# Patient Record
Sex: Male | Born: 1950 | Race: White | Hispanic: No | Marital: Married | Smoking: Light tobacco smoker
Health system: Southern US, Community
[De-identification: ages and names within clinical notes are randomized; demographics above are authoritative.]

## PROBLEM LIST (undated history)

## (undated) DIAGNOSIS — K5790 Diverticulosis of intestine, part unspecified, without perforation or abscess without bleeding: Secondary | ICD-10-CM

## (undated) DIAGNOSIS — R7989 Other specified abnormal findings of blood chemistry: Secondary | ICD-10-CM

## (undated) DIAGNOSIS — E785 Hyperlipidemia, unspecified: Secondary | ICD-10-CM

## (undated) DIAGNOSIS — E119 Type 2 diabetes mellitus without complications: Secondary | ICD-10-CM

## (undated) DIAGNOSIS — N529 Male erectile dysfunction, unspecified: Secondary | ICD-10-CM

## (undated) DIAGNOSIS — S82002A Unspecified fracture of left patella, initial encounter for closed fracture: Secondary | ICD-10-CM

## (undated) HISTORY — DX: Male erectile dysfunction, unspecified: N52.9

## (undated) HISTORY — DX: Other specified abnormal findings of blood chemistry: R79.89

## (undated) HISTORY — DX: Diverticulosis of intestine, part unspecified, without perforation or abscess without bleeding: K57.90

## (undated) HISTORY — PX: TONSILLECTOMY: SUR1361

## (undated) HISTORY — DX: Type 2 diabetes mellitus without complications: E11.9

## (undated) HISTORY — DX: Unspecified fracture of left patella, initial encounter for closed fracture: S82.002A

## (undated) HISTORY — DX: Hyperlipidemia, unspecified: E78.5

---

## 1998-11-22 ENCOUNTER — Emergency Department (HOSPITAL_COMMUNITY): Admission: EM | Admit: 1998-11-22 | Discharge: 1998-11-22 | Payer: Self-pay | Admitting: Emergency Medicine

## 2001-05-17 ENCOUNTER — Emergency Department (HOSPITAL_COMMUNITY): Admission: EM | Admit: 2001-05-17 | Discharge: 2001-05-17 | Payer: Self-pay | Admitting: Emergency Medicine

## 2001-05-17 ENCOUNTER — Encounter: Payer: Self-pay | Admitting: *Deleted

## 2001-09-06 ENCOUNTER — Encounter: Admission: RE | Admit: 2001-09-06 | Discharge: 2001-09-06 | Payer: Self-pay | Admitting: Family Medicine

## 2001-09-06 ENCOUNTER — Encounter: Payer: Self-pay | Admitting: Family Medicine

## 2001-09-10 ENCOUNTER — Encounter: Payer: Self-pay | Admitting: Orthopedic Surgery

## 2001-09-10 ENCOUNTER — Ambulatory Visit (HOSPITAL_COMMUNITY): Admission: RE | Admit: 2001-09-10 | Discharge: 2001-09-10 | Payer: Self-pay | Admitting: Orthopedic Surgery

## 2002-01-07 ENCOUNTER — Ambulatory Visit (HOSPITAL_COMMUNITY): Admission: RE | Admit: 2002-01-07 | Discharge: 2002-01-07 | Payer: Self-pay | Admitting: *Deleted

## 2007-02-18 HISTORY — PX: WISDOM TOOTH EXTRACTION: SHX21

## 2010-11-04 LAB — BASIC METABOLIC PANEL: BUN: 14 mg/dL (ref 4–21)

## 2010-12-05 ENCOUNTER — Ambulatory Visit (HOSPITAL_BASED_OUTPATIENT_CLINIC_OR_DEPARTMENT_OTHER)
Admission: RE | Admit: 2010-12-05 | Discharge: 2010-12-05 | Disposition: A | Discharge status: Home / Self Care | Payer: Managed Care, Other (non HMO) | Source: Ambulatory Visit | Attending: Orthopedic Surgery | Admitting: Orthopedic Surgery

## 2010-12-05 ENCOUNTER — Other Ambulatory Visit: Payer: Self-pay | Admitting: Orthopedic Surgery

## 2010-12-05 DIAGNOSIS — Z01810 Encounter for preprocedural cardiovascular examination: Secondary | ICD-10-CM | POA: Insufficient documentation

## 2010-12-05 DIAGNOSIS — Z01812 Encounter for preprocedural laboratory examination: Secondary | ICD-10-CM | POA: Insufficient documentation

## 2010-12-05 DIAGNOSIS — D1739 Benign lipomatous neoplasm of skin and subcutaneous tissue of other sites: Secondary | ICD-10-CM | POA: Insufficient documentation

## 2010-12-05 LAB — POCT HEMOGLOBIN-HEMACUE: Hemoglobin: 14.2 g/dL (ref 13.0–17.0)

## 2010-12-19 NOTE — Op Note (Signed)
NAMEJAXSEN, Alexander Blair            ACCOUNT NO.:  [id number]  MEDICAL RECORD NO.:  [id number]  LOCATION:                                 FACILITY:  PHYSICIAN:  Alexander Fitch. Tracy Kinner, M.D.      DATE OF BIRTH:  DATE OF PROCEDURE:  12/05/2010 DATE OF DISCHARGE:                              OPERATIVE REPORT   PREOPERATIVE DIAGNOSIS:  Large hypothenar and volar wrist lipoma, right distal forearm and hypothenar eminence with MRI documenting probable noninvolvement of median or ulnar nerves.  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  OPERATION:  Removal of 6 x 3 cm lipoma, right wrist, and hypothenar eminence, right hand.  OPERATING SURGEON:  Alexander Fitch. Kylia Grajales, MD  ASSISTANT:  Marveen Reeks Dasnoit, PA-C  ANESTHESIA:  General by LMA.  SUPERVISING ANESTHESIOLOGIST:  Alexander Mayo, MD  INDICATIONS:  Alexander Blair is a 60 year old gentleman referred through the courtesy of Dr. Dewaine Oats of [facility] for evaluation and management of a tumor on the volar aspect of his right wrist.  He had what appeared to be a tense lipoma or cyst at the distal wrist flexion crease.  Careful inspection of the hand revealed swelling in the hypothenar eminence and proximal palm.  As it is not unusual to see a fibrolipoma of the median nerve in this region, we referred Alexander Blair for an MRI of the wrist with contrast.  The MRI revealed that this was infiltrating into the subcutaneous region of the palm superficial to the transverse carpal ligament and was very closely situated to the ulnar nerve and artery in the hypothenar eminence and Guyon's canal.  We advised Alexander Blair to undergo exploration of this region.  We anticipated that we would need to dissect this mass from the environs of the ulnar nerve and artery.  It is possible that this could represent a fibrolipoma of  the ulnar nerve.  After informed consent, Alexander Blair was brought to the operating room at this time.  Preoperatively, he was advised of the potential risks and benefits of the surgery.  Should this be a fibrolipoma of the nerve, he could suffer a neurologic deficit.  Our goal was to relieve his mass effect, pressure symptoms, and prevent recurrence if possible.  His final prognosis will be dependent on histopathologic evaluation of this biopsy specimen.  PROCEDURE:  Alexander Blair was brought to room 1 of the [facility] and placed in supine position on the operating table.  Following the induction of general anesthesia by LMA technique under Dr. Jarrett Ables direct supervision, the right arm and hand were prepped with Betadine soap solution and sterilely draped.  A pneumatic tourniquet was applied to the proximal right brachium.  Following exsanguination of the right arm with an Esmarch bandage, the tourniquet on the proximal brachium was inflated to 250 mmHg.  Following routine surgical time-out, the procedure commenced with a transverse incision at the distal wrist flexion crease.  A tense lipoma was noted deep to the fascia.  The palmaris longus was identified.  The position of the ulnar artery and nerve and the palmar cutaneous branch  of the median nerve were all identified followed by circumferential dissection of an infiltrating lipoma.  The lipoma extended into the distal forearm 2.5 cm superficial to the median nerve and fascia.  It infiltrated into the canal of Guyon paralleling the path of the ulnar artery and ulnar nerve.  It infiltrated into the palm deep to the dermis but superficial to the deepest layers of the palmar fascia.  This was circumferentially dissected.  We ultimately created a T-shaped incision paralleling a standard carpal tunnel incision to allow safe exposure of the hypothenar eminence.  The palmaris brevis muscle was infiltrated with the  mass and was carefully dissected away from the mass.  We ultimately identified the ulnar artery and nerve proximally and carefully teased the mass off of the artery.  The vasa nervorum and the adventitia of the ulnar artery were involved with the lipoma.  These were carefully dissected with tenotomy scissors under loupe magnification.  After the entire mass was excised, it measured approximately 6 x 3 cm. Part of the mass was hypervascular and part appeared to be less vascular.  This was placed in formalin and passed off for pathologic evaluation.  The wound was then inspected with the tourniquet released for hemostasis.  Bleeding was controlled by direct pressure and bipolar cautery under saline.  The ulnar nerve and artery were otherwise normal in appearance.  Several cutaneous branches and muscle branches of the ulnar artery were inspected and found to be in normal repair.  The wound was then repaired with subcutaneous suture of 4-0 Vicryl and intradermal 3-0 Prolene segmental suture.  For aftercare, Mr. Kaufhold was placed in compressive dressing with sterile gauze, sterile Webril, and an Ace bandage.  We will see him back for followup in our office in 1 week for dressing change and probable suture removal as well as the discussion of his pathologic findings.  For aftercare, he is provided prescription for Vicodin 5 mg 1 p.o. q.4-6 hours p.r.n. pain.     Alexander Blair, M.D.     RVS/MEDQ  D:  12/05/2010  T:  12/05/2010  Job:  [id number]  cc:   Molly Maduro L. Foy [place], M.D.  Electronically Signed by Josephine Igo M.D. on 12/19/2010 05:11:12 PM

## 2012-02-18 HISTORY — PX: KNEE SURGERY: SHX244

## 2012-07-27 LAB — CBC AND DIFFERENTIAL
HCT: 42 % (ref 41–53)
Hemoglobin: 14.2 g/dL (ref 13.5–17.5)
Neutrophils Absolute: 3 /uL
Platelets: 200 10*3/uL (ref 150–399)
WBC: 6.8 10^3/mL

## 2012-09-07 LAB — HM COLONOSCOPY

## 2013-10-26 ENCOUNTER — Ambulatory Visit (INDEPENDENT_AMBULATORY_CARE_PROVIDER_SITE_OTHER): Payer: 59 | Admitting: Cardiology

## 2013-10-26 ENCOUNTER — Encounter: Payer: Self-pay | Admitting: Cardiology

## 2013-10-26 ENCOUNTER — Encounter (HOSPITAL_COMMUNITY): Payer: Self-pay | Admitting: *Deleted

## 2013-10-26 VITALS — BP 142/92 | HR 66 | Ht 70.0 in | Wt 227.5 lb

## 2013-10-26 DIAGNOSIS — E109 Type 1 diabetes mellitus without complications: Secondary | ICD-10-CM

## 2013-10-26 DIAGNOSIS — E119 Type 2 diabetes mellitus without complications: Secondary | ICD-10-CM | POA: Insufficient documentation

## 2013-10-26 DIAGNOSIS — R209 Unspecified disturbances of skin sensation: Secondary | ICD-10-CM

## 2013-10-26 DIAGNOSIS — R200 Anesthesia of skin: Secondary | ICD-10-CM | POA: Insufficient documentation

## 2013-10-26 DIAGNOSIS — E1065 Type 1 diabetes mellitus with hyperglycemia: Secondary | ICD-10-CM

## 2013-10-26 NOTE — Progress Notes (Signed)
HPI The patient presents for evaluation of multiple cardiovascular risk factors. He has had no history of coronary disease. He did have an episode of hand numbness about 4 weeks ago and this made him somewhat anxious. He said this happened at rest. He has not had this with activities. It came and went spontaneously over a couple of days. He did not describe any chest pressure or neck discomfort. He did not have any arm discomfort. He doesn't have any shortness of breath, PND or orthopnea. He doesn't have any palpitations, presyncope or syncope. He has no edema.  No Known Allergies  Current Outpatient Prescriptions  Medication Sig Dispense Refill  . atorvastatin (LIPITOR) 40 MG tablet Take 20 mg by mouth daily.        No current facility-administered medications for this visit.    Past Medical History  Diagnosis Date  . Hyperlipidemia   . Diverticulosis   . Obesity   . Rectal bleeding     Past Surgical History  Procedure Laterality Date  . Knee surgery  02/2013  . Tonsillectomy  35 yrs ago  . Colonoscopy  05/09, 11/03, 07/14    Family History  Problem Relation Age of Onset  . Hypertension Father   . Cancer Father   . Heart attack Paternal Grandfather   . Diabetes Brother   . Diabetes Father     History   Social History  . Marital Status: Married    Spouse Name: N/A    Number of Children: N/A  . Years of Education: N/A   Occupational History  . Not on file.   Social History Main Topics  . Smoking status: Light Tobacco Smoker    Last Attempt to Quit: 10/27/1983  . Smokeless tobacco: Not on file  . Alcohol Use: Yes  . Drug Use: No  . Sexual Activity: Not on file   Other Topics Concern  . Not on file   Social History Narrative  . No narrative on file    ROS:  . Positive for occasional mild headaches, constipation. Otherwise as stated in the history of present illness and negative for all other systems.  10/26/2013  PHYSICAL EXAM BP 142/92  Pulse 66  Ht  5\' 10"  (1.778 m)  Wt 227 lb 8 oz (103.193 kg)  BMI 32.64 kg/m2 GENERAL:  Well appearing HEENT:  Pupils equal round and reactive, fundi not visualized, oral mucosa unremarkable NECK:  No jugular venous distention, waveform within normal limits, carotid upstroke brisk and symmetric, no bruits, no thyromegaly LYMPHATICS:  No cervical, inguinal adenopathy LUNGS:  Clear to auscultation bilaterally BACK:  No CVA tenderness CHEST:  Unremarkable HEART:  PMI not displaced or sustained,S1 and S2 within normal limits, no S3, no S4, no clicks, no rubs, no  murmurs ABD:  Flat, positive bowel sounds normal in frequency in pitch, no bruits, no rebound, no guarding, no midline pulsatile mass, no hepatomegaly, no splenomegaly EXT:  2 plus pulses throughout, no edema, no cyanosis no clubbing SKIN:  No rashes no nodules NEURO:  Cranial nerves II through XII grossly intact, motor grossly intact throughout PSYCH:  Cognitively intact, oriented to person place and time  EKG:  Sinus rhythm, rate 66, axis within normal limits, intervals within normal limits, no acute ST-T wave changes.  ASSESSMENT AND PLAN  ARM PAIN:  The pretest probability of obstructive coronary disease is low. However, the patient does have significant risk factors.  I will bring the patient back for a POET (Plain Old Exercise  Test). This will allow me to screen for obstructive coronary disease, risk stratify and very importantly provide a prescription for exercise.  OVERWEIGHT:  The patient understands the need to lose weight with diet and exercise. We have discussed specific strategies for this.  DIABETES:  The patient does have type 2 diabetes with a hemoglobin A1c of 7.7. I reviewed this with him. I reviewed the fact that this is completely curable with diet and exercise in the vast majority of patients. We discussed specific strategies for this.  DYSLIPIDEMIA:  His LDL calculated was 93. HDL was 44. He will continue with therapy as  listed. I discussed the Mediterranean zone diet with him.

## 2013-10-26 NOTE — Patient Instructions (Signed)
Your physician recommends that you schedule a follow-up appointment in:  As needed  We are ordering a Treadmill test

## 2013-11-15 ENCOUNTER — Ambulatory Visit (HOSPITAL_COMMUNITY)
Admission: RE | Admit: 2013-11-15 | Discharge: 2013-11-15 | Disposition: A | Discharge status: Home / Self Care | Payer: 59 | Source: Ambulatory Visit | Attending: Cardiovascular Disease | Admitting: Cardiovascular Disease

## 2013-11-15 DIAGNOSIS — R209 Unspecified disturbances of skin sensation: Secondary | ICD-10-CM | POA: Diagnosis not present

## 2013-11-15 DIAGNOSIS — IMO0001 Reserved for inherently not codable concepts without codable children: Secondary | ICD-10-CM

## 2013-11-15 DIAGNOSIS — E109 Type 1 diabetes mellitus without complications: Secondary | ICD-10-CM | POA: Diagnosis not present

## 2013-11-15 DIAGNOSIS — R079 Chest pain, unspecified: Secondary | ICD-10-CM | POA: Insufficient documentation

## 2013-11-15 DIAGNOSIS — R200 Anesthesia of skin: Secondary | ICD-10-CM

## 2013-11-15 DIAGNOSIS — E1165 Type 2 diabetes mellitus with hyperglycemia: Secondary | ICD-10-CM

## 2013-11-15 DIAGNOSIS — E1065 Type 1 diabetes mellitus with hyperglycemia: Secondary | ICD-10-CM

## 2013-11-15 NOTE — Procedures (Signed)
Exercise Treadmill Test  Test  Exercise Tolerance Test Ordering MD: Marijo File, MD    Unique Test Number: 1  Treadmill:  1  Indication for ETT: chest pain - rule out ischemia  Contraindication to ETT: No   Stress Modality: exercise - treadmill  Cardiac Imaging Performed: non   Protocol: standard Bruce - maximal  Max BP:  147/88  Max MPHR (bpm):  157 85% MPR (bpm):  133  MPHR obtained (bpm):  148 % MPHR obtained:  94  Reached 85% MPHR (min:sec):  6z;30 Total Exercise Time (min-sec):  9  Workload in METS:  10.1 Borg Scale: 14  Reason ETT Terminated:  dyspnea    ST Segment Analysis At Rest: NSR with no ST changes. With Exercise: no evidence of significant ST depression  Other Information Arrhythmia:  No Angina during ETT:  absent (0) Quality of ETT:  diagnostic  ETT Interpretation:  normal - no evidence of ischemia by ST analysis  Comments: ETT with good exercise tolerance; no chest pain and normal BP response; no ST changes; negative adequate ETT.  Alexander Blair

## 2014-02-14 LAB — HM DIABETES FOOT EXAM

## 2014-07-19 DIAGNOSIS — N529 Male erectile dysfunction, unspecified: Secondary | ICD-10-CM

## 2014-07-19 DIAGNOSIS — R7989 Other specified abnormal findings of blood chemistry: Secondary | ICD-10-CM

## 2014-07-19 HISTORY — DX: Male erectile dysfunction, unspecified: N52.9

## 2014-07-19 HISTORY — DX: Other specified abnormal findings of blood chemistry: R79.89

## 2014-08-10 LAB — LIPID PANEL
Cholesterol: 131 mg/dL (ref 0–200)
HDL: 41 mg/dL (ref 35–70)
LDL CALC: 63 mg/dL
Triglycerides: 132 mg/dL (ref 40–160)

## 2014-08-10 LAB — PSA
PSA: 0.54
PSA: 0.54

## 2014-08-10 LAB — MICROALBUMIN, URINE: Microalb, Ur: <0.7

## 2014-12-12 LAB — HEMOGLOBIN A1C
HEMOGLOBIN A1C: 6
Hemoglobin A1C: 6

## 2014-12-12 LAB — BASIC METABOLIC PANEL
BUN: 14 mg/dL (ref 4–21)
CREATININE: 0.9 mg/dL (ref <=1.3)
Glucose: 114 mg/dL
Potassium: 4.8 mmol/L (ref 3.4–5.3)
Sodium: 139 mmol/L (ref 137–147)

## 2014-12-12 LAB — HM DIABETES FOOT EXAM

## 2015-05-18 ENCOUNTER — Telehealth: Payer: Self-pay | Admitting: Internal Medicine

## 2015-05-18 LAB — HM DIABETES EYE EXAM

## 2015-05-18 NOTE — Telephone Encounter (Signed)
Please advise 

## 2015-05-18 NOTE — Telephone Encounter (Signed)
Pt was referred by Evelena Peat (stating he is a current pt). He said he may have the name spelled wrong.   Pt has [employer] thru his employer.  Would Dr. Larose Kells take on as a new pt? Current PCP is retiring.

## 2015-05-18 NOTE — Telephone Encounter (Signed)
That is okay, schedule at the patient convenience

## 2015-05-28 ENCOUNTER — Encounter: Payer: Self-pay | Admitting: *Deleted

## 2015-05-28 ENCOUNTER — Telehealth: Payer: Self-pay | Admitting: *Deleted

## 2015-05-28 NOTE — Telephone Encounter (Addendum)
Pre-Visit Call completed with patient and chart updated.   Pre-Visit Info documented in Specialty Comments under [employer].    

## 2015-05-29 ENCOUNTER — Ambulatory Visit (INDEPENDENT_AMBULATORY_CARE_PROVIDER_SITE_OTHER): Payer: Self-pay | Admitting: Internal Medicine

## 2015-05-29 ENCOUNTER — Encounter: Payer: Self-pay | Admitting: Internal Medicine

## 2015-05-29 VITALS — BP 122/78 | HR 72 | Temp 97.9°F | Ht 69.0 in | Wt 207.4 lb

## 2015-05-29 DIAGNOSIS — Z7689 Persons encountering health services in other specified circumstances: Secondary | ICD-10-CM

## 2015-05-29 DIAGNOSIS — E785 Hyperlipidemia, unspecified: Secondary | ICD-10-CM | POA: Diagnosis not present

## 2015-05-29 DIAGNOSIS — E119 Type 2 diabetes mellitus without complications: Secondary | ICD-10-CM

## 2015-05-29 DIAGNOSIS — Z7189 Other specified counseling: Secondary | ICD-10-CM

## 2015-05-29 DIAGNOSIS — Z09 Encounter for follow-up examination after completed treatment for conditions other than malignant neoplasm: Secondary | ICD-10-CM | POA: Insufficient documentation

## 2015-05-29 LAB — COMPREHENSIVE METABOLIC PANEL
ALBUMIN: 4.3 g/dL (ref 3.5–5.2)
ALT: 19 U/L (ref 0–53)
AST: 16 U/L (ref 0–37)
Alkaline Phosphatase: 66 U/L (ref 39–117)
BUN: 16 mg/dL (ref 6–23)
CHLORIDE: 104 meq/L (ref 96–112)
CO2: 26 meq/L (ref 19–32)
CREATININE: 0.8 mg/dL (ref 0.40–1.50)
Calcium: 9.7 mg/dL (ref 8.4–10.5)
GFR: 103.07 mL/min (ref >60.00)
GLUCOSE: 185 mg/dL — AB (ref 70–99)
Potassium: 3.9 mEq/L (ref 3.5–5.1)
Sodium: 137 mEq/L (ref 135–145)
Total Bilirubin: 0.4 mg/dL (ref 0.2–1.2)
Total Protein: 7.3 g/dL (ref 6.0–8.3)

## 2015-05-29 LAB — HEMOGLOBIN A1C: HEMOGLOBIN A1C: 6.4 % (ref 4.6–6.5)

## 2015-05-29 NOTE — Progress Notes (Signed)
Pre visit review using our clinic review tool, if applicable. No additional management support is needed unless otherwise documented below in the visit note. 

## 2015-05-29 NOTE — Progress Notes (Signed)
   Subjective:    Patient ID: Alexander Blair, male    DOB: Dec 19, 1950, 65 y.o.   MRN: [id number]  DOS:  05/29/2015 Type of visit - description : new pt, previous M.D. retired Interval history: Here to get established, no major concerns. DM: Good compliance of medication, ambulatory CBGs at bedtime 90-140. High cholesterol: Good compliance with Lipitor, no apparent side effects   Review of Systems Denies chest pain or difficulty breathing No nausea, vomiting, diarrhea. Over the last year,  Diet has improved and is more active, per his own scales has lost about 30 pounds  Past Medical History  Diagnosis Date  . Hyperlipidemia   . Diverticulosis   . Diabetes [facility])     Past Surgical History  Procedure Laterality Date  . Knee surgery  02/2013    scope  . Tonsillectomy      Social History   Social History  . Marital Status: Married    Spouse Name: N/A  . Number of Children: 2  . Years of Education: N/A   Occupational History  . engineer    Social History Main Topics  . Smoking status: Light Tobacco Smoker    Last Attempt to Quit: 10/27/1983  . Smokeless tobacco: Not on file     Comment: quit in the 80s  . Alcohol Use: No  . Drug Use: No  . Sexual Activity: Not on file   Other Topics Concern  . Not on file   Social History Narrative   Original from [place]   Lives at home with wife.         Medication List       This list is accurate as of: 05/29/15  5:00 PM.  Always use your most recent med list.               aspirin 81 MG tablet  Take 81 mg by mouth daily.     atorvastatin 40 MG tablet  Commonly known as:  LIPITOR  Take 20 mg by mouth daily.     metFORMIN 500 MG tablet  Commonly known as:  GLUCOPHAGE  Take 500 mg by mouth daily.     ONE TOUCH ULTRA TEST test strip  Generic drug:  glucose blood  Reported on [id number]     [facility] DELICA LANCETS FINE Misc  Reported on 05/29/2015           Objective:   Physical Exam BP 122/78 mmHg   Pulse 72  Temp(Src) 97.9 F (36.6 C) (Oral)  Ht 5\' 9"  (1.753 m)  Wt 207 lb 6 oz (94.065 kg)  BMI 30.61 kg/m2  SpO2 94% General:   Well developed, well nourished . NAD.  HEENT:  Normocephalic . Face symmetric, atraumatic Neck: No thyromegaly Lungs:  CTA B Normal respiratory effort, no intercostal retractions, no accessory muscle use. Heart: RRR,  no murmur.  No pretibial edema bilaterally  Skin: Not pale. Not jaundice Neurologic:  alert & oriented X3.  Speech normal, gait appropriate for age and unassisted Psych--  Cognition and judgment appear intact.  Cooperative with normal attention span and concentration.  Behavior appropriate. No anxious or depressed appearing.      Assessment & Plan:   Assessment DM- dx ~ 2010 Hyperlipidemia  Treadmill test 11-2013 (-)  Plan: DM: Continue metformin, check a CMP and A1c. Get records Hyperlipidemia: Continue Lipitor, get records, refill as needed RTC 2-3 months for a physical fasting.

## 2015-05-29 NOTE — Assessment & Plan Note (Signed)
DM: Continue metformin, check a CMP and A1c. Get records Hyperlipidemia: Continue Lipitor, get records, refill as needed RTC 2-3 months for a physical fasting.

## 2015-05-29 NOTE — Patient Instructions (Signed)
GO TO THE LAB :      Get the blood work      FRONT DESK  Schedule the patient for a physical exam in 2 or 3 months , okay to put together two 15-min appointments Needs to be fasting  Ask  patient to sign a release of information then fax it to [place]: all Records

## 2015-06-14 ENCOUNTER — Telehealth: Payer: Self-pay | Admitting: *Deleted

## 2015-06-14 NOTE — Telephone Encounter (Signed)
Forwarded to Dr. Paz. JG//CMA 

## 2015-06-28 ENCOUNTER — Encounter: Payer: Self-pay | Admitting: Internal Medicine

## 2015-08-08 ENCOUNTER — Telehealth: Payer: Self-pay | Admitting: Internal Medicine

## 2015-08-08 ENCOUNTER — Encounter: Payer: Self-pay | Admitting: Internal Medicine

## 2015-08-08 DIAGNOSIS — Z008 Encounter for other general examination: Secondary | ICD-10-CM

## 2015-08-08 DIAGNOSIS — Z0189 Encounter for other specified special examinations: Principal | ICD-10-CM

## 2015-08-08 NOTE — Telephone Encounter (Signed)
Form is requiring fasting labs ((lipid panel, A1c (only if MD recommends) and waist circumference)).  Pt had a new new pt appt with Dr Larose Kells in April 2017, but fasting labs need to be between 11/18/14 and 11/17/15. Will pt need an office visit or just a lab/nurse visit? Please advise

## 2015-08-08 NOTE — Telephone Encounter (Signed)
Pt dropped off document to be filled out (Biometric Measures & Physical Confirmation)

## 2015-08-08 NOTE — Telephone Encounter (Signed)
Called and left message for pt to please return call.  Pt needs lab visit for fasting lipid panel. Pt must fast 8 hrs prior to appt. Pt also needs nurse visit for waist circumference.

## 2015-08-08 NOTE — Telephone Encounter (Signed)
Needs a form: Check a FLP, please arrange Check abdominal circumference Will complete form when labs are back.   Chart reviewed: Colonoscopies: 06/25/2007, 01/07/2002, 08-2012. Per chart review. No actual reports H/o E.D. Testosterone was low at 247 (2016), recheck 08/22/2014: 267, FSH 3.1 normal, LH 5.1 normal was prescribed testosterone supplements.. Other  labs:  08/10/2014: Microalbumin normal. PSA 0.5.  Cholesterol 131, HDL 41, LDL 63 12/12/2014 A1c 6.0. Creatinine 0.8, potassium 4.8,

## 2015-08-09 ENCOUNTER — Ambulatory Visit: Payer: 59 | Admitting: *Deleted

## 2015-08-09 ENCOUNTER — Other Ambulatory Visit (INDEPENDENT_AMBULATORY_CARE_PROVIDER_SITE_OTHER): Payer: 59

## 2015-08-09 DIAGNOSIS — Z008 Encounter for other general examination: Secondary | ICD-10-CM

## 2015-08-09 DIAGNOSIS — Z0189 Encounter for other specified special examinations: Secondary | ICD-10-CM | POA: Diagnosis not present

## 2015-08-09 DIAGNOSIS — Z0000 Encounter for general adult medical examination without abnormal findings: Secondary | ICD-10-CM

## 2015-08-09 LAB — LIPID PANEL
CHOL/HDL RATIO: 3
CHOLESTEROL: 141 mg/dL (ref 0–200)
HDL: 44.8 mg/dL (ref >39.00)
LDL Cholesterol: 79 mg/dL (ref 0–99)
NonHDL: 96.2
TRIGLYCERIDES: 85 mg/dL (ref 0.0–149.0)
VLDL: 17 mg/dL (ref 0.0–40.0)

## 2015-08-09 NOTE — Progress Notes (Signed)
Pre visit review using our clinic review tool, if applicable. No additional management support is needed unless otherwise documented below in the visit note.  Pt here for waist circumference measurement per 08/08/15 phone note.   Waist circumference: 43 in  Dorrene German, RN

## 2015-08-10 NOTE — Telephone Encounter (Signed)
Pt came in on 08/09/15 to have labs drawn and waist circumference measured. Form completed with results and forwarded to Dr. Larose Kells. JG//CMA

## 2015-08-13 NOTE — Telephone Encounter (Signed)
Called and lm for pt to please return call.  Form is completed and ready for pick up. It is at the front desk. Copy sent for scanning. JG//CMA

## 2015-08-20 ENCOUNTER — Encounter: Payer: 59 | Admitting: Internal Medicine

## 2015-12-18 ENCOUNTER — Encounter: Payer: Self-pay | Admitting: Internal Medicine

## 2015-12-18 ENCOUNTER — Ambulatory Visit (INDEPENDENT_AMBULATORY_CARE_PROVIDER_SITE_OTHER): Payer: 59 | Admitting: Internal Medicine

## 2015-12-18 VITALS — BP 118/78 | HR 58 | Temp 98.1°F | Resp 14 | Ht 69.0 in | Wt 204.4 lb

## 2015-12-18 DIAGNOSIS — Z114 Encounter for screening for human immunodeficiency virus [HIV]: Secondary | ICD-10-CM

## 2015-12-18 DIAGNOSIS — E1065 Type 1 diabetes mellitus with hyperglycemia: Secondary | ICD-10-CM | POA: Diagnosis not present

## 2015-12-18 DIAGNOSIS — Z0000 Encounter for general adult medical examination without abnormal findings: Secondary | ICD-10-CM

## 2015-12-18 LAB — HEMOGLOBIN A1C: HEMOGLOBIN A1C: 6.1 % (ref 4.6–6.5)

## 2015-12-18 LAB — BASIC METABOLIC PANEL
BUN: 16 mg/dL (ref 6–23)
CHLORIDE: 103 meq/L (ref 96–112)
CO2: 25 mEq/L (ref 19–32)
Calcium: 9.7 mg/dL (ref 8.4–10.5)
Creatinine, Ser: 0.85 mg/dL (ref 0.40–1.50)
GFR: 95.94 mL/min (ref >60.00)
GLUCOSE: 116 mg/dL — AB (ref 70–99)
POTASSIUM: 4.1 meq/L (ref 3.5–5.1)
Sodium: 138 mEq/L (ref 135–145)

## 2015-12-18 LAB — CBC WITH DIFFERENTIAL/PLATELET
BASOS PCT: 0.4 % (ref 0.0–3.0)
Basophils Absolute: 0 10*3/uL (ref 0.0–0.1)
EOS PCT: 2.8 % (ref 0.0–5.0)
Eosinophils Absolute: 0.2 10*3/uL (ref 0.0–0.7)
HCT: 45.9 % (ref 39.0–52.0)
HEMOGLOBIN: 15.5 g/dL (ref 13.0–17.0)
LYMPHS ABS: 2.4 10*3/uL (ref 0.7–4.0)
Lymphocytes Relative: 31.8 % (ref 12.0–46.0)
MCHC: 33.8 g/dL (ref 30.0–36.0)
MCV: 89.3 fl (ref 78.0–100.0)
MONO ABS: 0.6 10*3/uL (ref 0.1–1.0)
MONOS PCT: 7.4 % (ref 3.0–12.0)
NEUTROS PCT: 57.6 % (ref 43.0–77.0)
Neutro Abs: 4.3 10*3/uL (ref 1.4–7.7)
Platelets: 206 10*3/uL (ref 150.0–400.0)
RBC: 5.14 Mil/uL (ref 4.22–5.81)
RDW: 12.5 % (ref 11.5–15.5)
WBC: 7.5 10*3/uL (ref 4.0–10.5)

## 2015-12-18 LAB — MICROALBUMIN / CREATININE URINE RATIO
Creatinine,U: 185.9 mg/dL
Microalb Creat Ratio: 1 mg/g (ref 0.0–30.0)
Microalb, Ur: 1.8 mg/dL (ref 0.0–1.9)

## 2015-12-18 NOTE — Progress Notes (Signed)
Subjective:    Patient ID: Alexander Blair, male    DOB: [birth year], 65 y.o.   MRN: [id number]  DOS:  12/18/2015 Type of visit - description : CPX Interval history: we also discussed DM and cholesterol    Review of Systems Constitutional: No fever. No chills. No unexplained wt changes. No unusual sweats  HEENT: No dental problems, no ear discharge, no facial swelling, no voice changes. No eye discharge, no eye  redness , no  intolerance to light   Respiratory: No wheezing , no  difficulty breathing. No cough , no mucus production  Cardiovascular: No CP, no leg swelling , no  Palpitations  GI: no nausea, no vomiting, no diarrhea , no  abdominal pain.  No blood in the stools. No dysphagia, no odynophagia Occasionally red blood per rectum, chronic issue, patient thinks due to hemorrhoids    Endocrine: No polyphagia, no polyuria , no polydipsia  GU: No dysuria, gross hematuria, difficulty urinating. No urinary urgency, no frequency.  Musculoskeletal: No joint swellings or unusual aches or pains  Skin: No change in the color of the skin, palor , no  Rash  Allergic, immunologic: No environmental allergies , no  food allergies  Neurological: No dizziness no  syncope. No headaches. No diplopia, no slurred, no slurred speech, no motor deficits, no facial  Numbness  Hematological: No enlarged lymph nodes, no easy bruising , no unusual bleedings  Psychiatry: No suicidal ideas, no hallucinations, no beavior problems, no confusion.  No unusual/severe anxiety, no depression   Past Medical History:  Diagnosis Date  . Diabetes ([place])   . Diverticulosis   . ED (erectile dysfunction) 07/2014  . Hyperlipidemia   . Low testosterone 07/2014    Past Surgical History:  Procedure Laterality Date  . KNEE SURGERY Right 2014  . TONSILLECTOMY    . WISDOM TOOTH EXTRACTION  2009    Social History   Social History  . Marital status: Married    Spouse name: N/A  . Number of children: 2    . Years of education: N/A   Occupational History  . engineer    Social History Main Topics  . Smoking status: Light Tobacco Smoker    Last attempt to quit: 10/27/1983  . Smokeless tobacco: Never Used     Comment: quit in the 80s  . Alcohol use No  . Drug use: No  . Sexual activity: Not on file   Other Topics Concern  . Not on file   Social History Narrative   Original from [place]   Lives at home with wife.      Family History  Problem Relation Age of Onset  . Hypertension Father   . Cancer Father     type?   . Diabetes Father   . Heart disease Father   . Heart attack Paternal Grandfather   . Diabetes Brother   . Colon cancer Neg Hx   . Prostate cancer Neg Hx        Medication List       Accurate as of 12/18/15 11:59 PM. Always use your most recent med list.          aspirin 81 MG tablet Take 81 mg by mouth daily.   atorvastatin 40 MG tablet Commonly known as:  LIPITOR Take 20 mg by mouth daily.   metFORMIN 500 MG tablet Commonly known as:  GLUCOPHAGE Take 500 mg by mouth daily.   ONE TOUCH ULTRA TEST test strip Generic drug:  glucose  blood Reported on [id number]   [facility] DELICA LANCETS FINE Misc Reported on 05/29/2015          Objective:   Physical Exam BP 118/78 (BP Location: Left Arm, Patient Position: Sitting, Cuff Size: Normal)   Pulse (!) 58   Temp 98.1 F (36.7 C) (Oral)   Resp 14   Ht 5\' 9"  (1.753 m)   Wt 204 lb 6 oz (92.7 kg)   SpO2 98%   BMI 30.18 kg/m  General:   Well developed, well nourished . NAD.  Neck: No  thyromegaly , no LADs HEENT:  Normocephalic . Face symmetric, atraumatic Lungs:  CTA B Normal respiratory effort, no intercostal retractions, no accessory muscle use. Heart: RRR,  no murmur.  No pretibial edema bilaterally  Abdomen:  Not distended, soft, non-tender. No rebound or rigidity. No bruit    DIABETIC FEET EXAM: No lower extremity edema Normal pedal pulses bilaterally Skin normal, nails normal, no  calluses Pinprick examination of the feet normal. Neurologic:  alert & oriented X3.  Speech normal, gait appropriate for age and unassisted Strength symmetric and appropriate for age.  Psych: Cognition and judgment appear intact.  Cooperative with normal attention span and concentration.  Behavior appropriate. No anxious or depressed appearing.     Assessment & Plan:   Assessment DM- dx ~ 2010 Hyperlipidemia  Treadmill test 11-2013 (-)  Plan: DM: Reports CBGs at night has increased slightly lately to the 140s, no change in diet. Plan is to continue metformin, check A1c and microalbumin. Foot exam normal. Hyperlipidemia: On Lipitor, last FLP satisfactory. Also reported a slt decrease in sex drive, some ED, not causing problems, agreed on observation RTC 6 months

## 2015-12-18 NOTE — Patient Instructions (Signed)
GO TO THE LAB : Get the blood work     GO TO THE FRONT DESK Schedule your next appointment for a  Check up in 6 months, fasting  Check your blood sugars once daily, fasting should be between 90 and 120 After meals should not be more than 180.

## 2015-12-18 NOTE — Assessment & Plan Note (Addendum)
Only immunization due is prevnar for next year Had a flu shot  CCS: Cscope 2014, next per GI PSA 07-2014 wnl Diet and exercise discussed

## 2015-12-18 NOTE — Progress Notes (Signed)
Pre visit review using our clinic review tool, if applicable. No additional management support is needed unless otherwise documented below in the visit note. 

## 2015-12-19 LAB — HIV ANTIBODY (ROUTINE TESTING W REFLEX): HIV: NONREACTIVE

## 2015-12-19 NOTE — Assessment & Plan Note (Signed)
DM: Reports CBGs at night has increased slightly lately to the 140s, no change in diet. Plan is to continue metformin, check A1c and microalbumin. Foot exam normal. Hyperlipidemia: On Lipitor, last FLP satisfactory. Also reported a slt decrease in sex drive, some ED, not causing problems, agreed on observation RTC 6 months

## 2015-12-31 ENCOUNTER — Telehealth: Payer: Self-pay | Admitting: Internal Medicine

## 2015-12-31 MED ORDER — ATORVASTATIN CALCIUM 40 MG PO TABS: 20.0000 mg | ORAL_TABLET | Freq: Every day | ORAL | 2 refills | Status: DC

## 2015-12-31 MED ORDER — METFORMIN HCL 500 MG PO TABS: 500.0000 mg | ORAL_TABLET | Freq: Every day | ORAL | 2 refills | Status: DC

## 2015-12-31 NOTE — Telephone Encounter (Signed)
Rxs sent

## 2015-12-31 NOTE — Telephone Encounter (Signed)
Relation to [id number] Call back number:[phone] Pharmacy: [place], [place] [phone] (Phone) [phone] (Fax)     Reason for call:  Patient requesting a refill metFORMIN (GLUCOPHAGE) 500 MG tablet and atorvastatin (LIPITOR) 40 MG tablet

## 2016-01-01 HISTORY — PX: HEMORRHOID BANDING: SHX5850

## 2016-03-12 ENCOUNTER — Ambulatory Visit (INDEPENDENT_AMBULATORY_CARE_PROVIDER_SITE_OTHER): Payer: 59 | Admitting: Internal Medicine

## 2016-03-12 ENCOUNTER — Encounter: Payer: Self-pay | Admitting: Internal Medicine

## 2016-03-12 ENCOUNTER — Ambulatory Visit (HOSPITAL_BASED_OUTPATIENT_CLINIC_OR_DEPARTMENT_OTHER)
Admission: RE | Admit: 2016-03-12 | Discharge: 2016-03-12 | Disposition: A | Discharge status: Home / Self Care | Payer: 59 | Source: Ambulatory Visit | Attending: Internal Medicine | Admitting: Internal Medicine

## 2016-03-12 VITALS — BP 124/76 | HR 70 | Temp 98.1°F | Resp 14 | Ht 69.0 in | Wt 214.2 lb

## 2016-03-12 DIAGNOSIS — M1712 Unilateral primary osteoarthritis, left knee: Secondary | ICD-10-CM | POA: Diagnosis not present

## 2016-03-12 DIAGNOSIS — X58XXXA Exposure to other specified factors, initial encounter: Secondary | ICD-10-CM | POA: Insufficient documentation

## 2016-03-12 DIAGNOSIS — S8392XA Sprain of unspecified site of left knee, initial encounter: Secondary | ICD-10-CM | POA: Diagnosis present

## 2016-03-12 DIAGNOSIS — Q741 Congenital malformation of knee: Secondary | ICD-10-CM | POA: Diagnosis not present

## 2016-03-12 NOTE — Patient Instructions (Signed)
Get a x-ray of the knee down stairs  Tylenol  500 mg OTC 2 tabs a day every 8 hours as needed for pain  Try to minimize the use of ibuprofen to every other day  Ice pack at night  Call if no better in 2-3 weeks

## 2016-03-12 NOTE — Progress Notes (Signed)
Subjective:    Patient ID: Alexander Blair, male    DOB: 1950-05-22, 66 y.o.   MRN: [id number]  DOS:  03/12/2016 Type of visit - description : acute Interval history: 3 weeks ago he was playing with his daughter's dog, he was running, and to avoid step on the  dog he miss stepped, hyperflex his left great toe and twisted his left knee. Having pain since then, is actually slightly worse. The knee pain is anterior and encompass the proximal  pretibial area   Review of Systems No other injuries, no neck or back pain  Past Medical History:  Diagnosis Date  . Diabetes ([place])   . Diverticulosis   . ED (erectile dysfunction) 07/2014  . Hyperlipidemia   . Low testosterone 07/2014    Past Surgical History:  Procedure Laterality Date  . HEMORRHOID BANDING  01/01/2016  . KNEE SURGERY Right 2014  . TONSILLECTOMY    . WISDOM TOOTH EXTRACTION  2009    Social History   Social History  . Marital status: Married    Spouse name: N/A  . Number of children: 2  . Years of education: N/A   Occupational History  . engineer    Social History Main Topics  . Smoking status: Light Tobacco Smoker    Last attempt to quit: 10/27/1983  . Smokeless tobacco: Never Used     Comment: quit in the 80s  . Alcohol use No  . Drug use: No  . Sexual activity: Not on file   Other Topics Concern  . Not on file   Social History Narrative   Original from [place]   Lives at home with wife.       Allergies as of 03/12/2016   No Known Allergies     Medication List       Accurate as of 03/12/16  1:04 PM. Always use your most recent med list.          aspirin 81 MG tablet Take 81 mg by mouth daily.   atorvastatin 40 MG tablet Commonly known as:  LIPITOR Take 0.5 tablets (20 mg total) by mouth daily.   metFORMIN 500 MG tablet Commonly known as:  GLUCOPHAGE Take 1 tablet (500 mg total) by mouth daily.   ONE TOUCH ULTRA TEST test strip Generic drug:  glucose blood Reported on [id number]     [facility] DELICA LANCETS FINE Misc Reported on 05/29/2015          Objective:   Physical Exam BP 124/76 (BP Location: Left Arm, Patient Position: Sitting, Cuff Size: Normal)   Pulse 70   Temp 98.1 F (36.7 C) (Oral)   Resp 14   Ht 5\' 9"  (1.753 m)   Wt 214 lb 4 oz (97.2 kg)   SpO2 98%   BMI 31.64 kg/m  General:   Well developed, well nourished . NAD.  HEENT:  Normocephalic . Face symmetric, atraumatic MSK Right knee normal Left knee: No deformity, redness, minimal swelling if any. Range of motion normal, joint is stable.  L great toe: Has a subungual ecchymosis , otherwise dose normal.  Skin: Not pale. Not jaundice Neurologic:  alert & oriented X3.  Speech normal, gait appropriate for age and unassisted Psych--  Cognition and judgment appear intact.  Cooperative with normal attention span and concentration.  Behavior appropriate. No anxious or depressed appearing.      Assessment & Plan:   Assessment DM- dx ~ 2010 Hyperlipidemia  Treadmill test 11-2013 (-)  Plan: Knee sprain,  great toe injury. To be sure we'll get a x-ray, otherwise conservative treatment with Tylenol, ice packs and a knee sleeve. He is taking ibuprofen, that is okay but recommend to try to minimize it's use. If not better, will send to sports medicine

## 2016-03-12 NOTE — Progress Notes (Signed)
Pre visit review using our clinic review tool, if applicable. No additional management support is needed unless otherwise documented below in the visit note. 

## 2016-03-13 NOTE — Assessment & Plan Note (Signed)
Knee sprain, great toe injury. To be sure we'll get a x-ray, otherwise conservative treatment with Tylenol, ice packs and a knee sleeve. He is taking ibuprofen, that is okay but recommend to try to minimize it's use. If not better, will send to sports medicine

## 2016-04-03 ENCOUNTER — Encounter: Payer: Self-pay | Admitting: Internal Medicine

## 2016-04-03 LAB — HM SIGMOIDOSCOPY

## 2016-04-09 ENCOUNTER — Encounter: Payer: Self-pay | Admitting: Internal Medicine

## 2016-06-03 LAB — HM DIABETES EYE EXAM

## 2016-06-12 ENCOUNTER — Encounter: Payer: Self-pay | Admitting: Internal Medicine

## 2016-06-18 ENCOUNTER — Encounter: Payer: Self-pay | Admitting: Internal Medicine

## 2016-06-18 ENCOUNTER — Ambulatory Visit (INDEPENDENT_AMBULATORY_CARE_PROVIDER_SITE_OTHER): Payer: 59 | Admitting: Internal Medicine

## 2016-06-18 VITALS — BP 128/76 | HR 64 | Temp 97.5°F | Resp 14 | Ht 69.0 in | Wt 209.5 lb

## 2016-06-18 DIAGNOSIS — Z1159 Encounter for screening for other viral diseases: Secondary | ICD-10-CM

## 2016-06-18 DIAGNOSIS — E119 Type 2 diabetes mellitus without complications: Secondary | ICD-10-CM | POA: Diagnosis not present

## 2016-06-18 DIAGNOSIS — S8392XA Sprain of unspecified site of left knee, initial encounter: Secondary | ICD-10-CM | POA: Diagnosis not present

## 2016-06-18 DIAGNOSIS — E1065 Type 1 diabetes mellitus with hyperglycemia: Secondary | ICD-10-CM

## 2016-06-18 DIAGNOSIS — E785 Hyperlipidemia, unspecified: Secondary | ICD-10-CM | POA: Diagnosis not present

## 2016-06-18 LAB — LIPID PANEL
CHOL/HDL RATIO: 3
Cholesterol: 155 mg/dL (ref 0–200)
HDL: 48.7 mg/dL (ref >39.00)
LDL CALC: 81 mg/dL (ref 0–99)
NONHDL: 105.94
Triglycerides: 123 mg/dL (ref 0.0–149.0)
VLDL: 24.6 mg/dL (ref 0.0–40.0)

## 2016-06-18 LAB — HEMOGLOBIN A1C: Hgb A1c MFr Bld: 6.6 % — ABNORMAL HIGH (ref 4.6–6.5)

## 2016-06-18 LAB — AST: AST: 16 U/L (ref 0–37)

## 2016-06-18 LAB — ALT: ALT: 19 U/L (ref 0–53)

## 2016-06-18 MED ORDER — ATORVASTATIN CALCIUM 40 MG PO TABS: 20.0000 mg | ORAL_TABLET | Freq: Every day | ORAL | 2 refills | Status: DC

## 2016-06-18 MED ORDER — METFORMIN HCL 500 MG PO TABS: 500.0000 mg | ORAL_TABLET | Freq: Every day | ORAL | 2 refills | Status: DC

## 2016-06-18 NOTE — Patient Instructions (Signed)
GO TO THE LAB : Get the blood work     GO TO THE FRONT DESK Schedule your next appointment for a  physical exam in 6-7 months.

## 2016-06-18 NOTE — Assessment & Plan Note (Signed)
DM: On metformin qd, checking her A1c. Has been unable to exercise much lately, A1c may be slightly elevated Hyperlipidemia: Good compliance with Lipitor, check FLP, AST, ALT Knee sprain: After last visit, went to orthopedic surgery, MRI show a "fracture". Was Rx conservative treatment-crutches, got better but sxs resurface few weeks ago. He is planning to see ortho if sx continue RTC 6-7 months

## 2016-06-18 NOTE — Progress Notes (Signed)
Subjective:    Patient ID: Alexander Blair, male    DOB: 1950-08-02, 66 y.o.   MRN: [id number]  DOS:  06/18/2016 Type of visit - description : Routine checkup Interval history: DM: Good compliance of medication, ambulatory CBGs ranged from 120-160. High cholesterol: Good compliance with statins, due for labs. He was here, knee pain got worse, went to the orthopedic doctor, MRI was done, dx w/ a fracture?Marland Kitchen Was recommended conservative treatment, symptoms got better but in the last few weeks they resurface after he did some yard work.   Review of Systems   Past Medical History:  Diagnosis Date  . Diabetes ([place])   . Diverticulosis   . ED (erectile dysfunction) 07/2014  . Hyperlipidemia   . Left patella fracture   . Low testosterone 07/2014    Past Surgical History:  Procedure Laterality Date  . HEMORRHOID BANDING  01/01/2016  . KNEE SURGERY Right 2014  . TONSILLECTOMY    . WISDOM TOOTH EXTRACTION  2009    Social History   Social History  . Marital status: Married    Spouse name: N/A  . Number of children: 2  . Years of education: N/A   Occupational History  . engineer    Social History Main Topics  . Smoking status: Light Tobacco Smoker    Last attempt to quit: 10/27/1983  . Smokeless tobacco: Never Used     Comment: quit in the 80s  . Alcohol use No  . Drug use: No  . Sexual activity: Not on file   Other Topics Concern  . Not on file   Social History Narrative   Original from [place]   Lives at home with wife.       Allergies as of 06/18/2016   No Known Allergies     Medication List       Accurate as of 06/18/16  6:04 PM. Always use your most recent med list.          aspirin 81 MG tablet Take 81 mg by mouth daily.   atorvastatin 40 MG tablet Commonly known as:  LIPITOR Take 0.5 tablets (20 mg total) by mouth daily.   metFORMIN 500 MG tablet Commonly known as:  GLUCOPHAGE Take 1 tablet (500 mg total) by mouth daily.   ONE TOUCH ULTRA TEST  test strip Generic drug:  glucose blood Reported on 04/30/9700   [facility] DELICA LANCETS FINE Misc Reported on 05/29/2015          Objective:   Physical Exam BP 128/76 (BP Location: Left Arm, Patient Position: Sitting, Cuff Size: Normal)   Pulse 64   Temp 97.5 F (36.4 C) (Oral)   Resp 14   Ht 5\' 9"  (1.753 m)   Wt 209 lb 8 oz (95 kg)   SpO2 96%   BMI 30.94 kg/m  General:   Well developed, well nourished . NAD.  HEENT:  Normocephalic . Face symmetric, atraumatic Lungs:  CTA B Normal respiratory effort, no intercostal retractions, no accessory muscle use. Heart: RRR,  no murmur.  No pretibial edema bilaterally  Skin: Not pale. Not jaundice Neurologic:  alert & oriented X3.  Speech normal, gait: Mild limp due to knee pain Psych--  Cognition and judgment appear intact.  Cooperative with normal attention span and concentration.  Behavior appropriate. No anxious or depressed appearing.      Assessment & Plan:    Assessment DM- dx ~ 2010 Hyperlipidemia  Treadmill test 11-2013 (-)  Plan: DM: On metformin qd,  checking her A1c. Has been unable to exercise much lately, A1c may be slightly elevated Hyperlipidemia: Good compliance with Lipitor, check FLP, AST, ALT Knee sprain: After last visit, went to orthopedic surgery, MRI show a "fracture". Was Rx conservative treatment-crutches, got better but sxs resurface few weeks ago. He is planning to see ortho if sx continue RTC 6-7 months

## 2016-06-18 NOTE — Progress Notes (Signed)
Pre visit review using our clinic review tool, if applicable. No additional management support is needed unless otherwise documented below in the visit note. 

## 2016-06-19 LAB — HEPATITIS C ANTIBODY: HCV AB: NEGATIVE

## 2016-06-25 ENCOUNTER — Other Ambulatory Visit: Payer: Self-pay

## 2016-06-27 ENCOUNTER — Other Ambulatory Visit: Payer: Self-pay

## 2016-06-27 LAB — HM SIGMOIDOSCOPY

## 2016-06-27 MED ORDER — ONETOUCH ULTRA BLUE VI STRP: ORAL_STRIP | 12 refills | Status: DC

## 2016-06-27 MED ORDER — ONETOUCH DELICA LANCETS FINE MISC: 12 refills | Status: DC

## 2016-07-07 ENCOUNTER — Encounter: Payer: Self-pay | Admitting: Internal Medicine

## 2016-08-11 ENCOUNTER — Telehealth: Payer: Self-pay | Admitting: Internal Medicine

## 2016-08-11 NOTE — Telephone Encounter (Signed)
Pt brought forms for work ins wellness program to have physician fill in lab results and sign. Call pt when complete [phone]. Placed forms in front office tray.

## 2016-08-13 NOTE — Telephone Encounter (Signed)
Completed as much as possible, patient has not had recent Blood Glucose check and needs weight circumference, smoking status contradicting [light smoker, quit in [place]; forwarded to provider for completion/SLS 06/27

## 2016-08-18 NOTE — Telephone Encounter (Signed)
Form completed, spoke w/ Pt, informed that form has been placed at front desk for pick up at front desk. Copy of form sent for scanning.

## 2016-09-11 ENCOUNTER — Encounter: Payer: Self-pay | Admitting: Medical

## 2016-09-11 ENCOUNTER — Ambulatory Visit (INDEPENDENT_AMBULATORY_CARE_PROVIDER_SITE_OTHER): Payer: 59 | Admitting: Medical

## 2016-09-11 VITALS — BP 135/93 | HR 78 | Temp 98.2°F | Resp 16 | Ht 69.0 in | Wt 207.0 lb

## 2016-09-11 DIAGNOSIS — R05 Cough: Secondary | ICD-10-CM

## 2016-09-11 DIAGNOSIS — R059 Cough, unspecified: Secondary | ICD-10-CM

## 2016-09-11 DIAGNOSIS — J029 Acute pharyngitis, unspecified: Secondary | ICD-10-CM

## 2016-09-11 DIAGNOSIS — J019 Acute sinusitis, unspecified: Secondary | ICD-10-CM | POA: Diagnosis not present

## 2016-09-11 MED ORDER — BENZONATATE 100 MG PO CAPS: 100.0000 mg | ORAL_CAPSULE | Freq: Three times a day (TID) | ORAL | 0 refills | Status: DC | PRN

## 2016-09-11 MED ORDER — AMOXICILLIN-POT CLAVULANATE 875-125 MG PO TABS: 1.0000 | ORAL_TABLET | Freq: Two times a day (BID) | ORAL | 0 refills | Status: DC

## 2016-09-11 MED ORDER — FLUTICASONE PROPIONATE 50 MCG/ACT NA SUSP: 2.0000 | Freq: Every day | NASAL | 1 refills | Status: AC

## 2016-09-11 NOTE — Progress Notes (Signed)
Subjective:    Patient ID: Alexander Blair, male    DOB: 1950-08-16, 66 y.o.   MRN: [id number]  HPI  Pt in with recent hoarse voice and sore throat. Hurts to swallow. Pt states stuffy nose/nasal congestion. Some sinus pressure and mild pain.When cough gets up some mucous. Pt states has not bee sneezing.  Cough is not severe. No wheezing.  Pt symptoms for 4-5 days. Not improving but getting progressively worse.   (Pt expressed needing to get to business meeting across in [place]. So opted not to do strep test as would not change my tx and he would get to meeting late)   Review of Systems  Constitutional: Negative for chills, fatigue and fever.  HENT: Positive for congestion, sinus pain, sinus pressure and sore throat. Negative for ear pain, postnasal drip, sneezing and trouble swallowing.   Respiratory: Positive for cough. Negative for chest tightness, shortness of breath and wheezing.   Cardiovascular: Negative for chest pain and palpitations.  Gastrointestinal: Negative for abdominal pain, nausea and vomiting.  Musculoskeletal: Negative for arthralgias, back pain and myalgias.  Neurological: Negative for dizziness and headaches.  Hematological: Negative for adenopathy.  Psychiatric/Behavioral: Negative for behavioral problems and confusion.   Past Medical History:  Diagnosis Date  . Diabetes ([place])   . Diverticulosis   . ED (erectile dysfunction) 07/2014  . Hyperlipidemia   . Left patella fracture   . Low testosterone 07/2014     Social History   Social History  . Marital status: Married    Spouse name: N/A  . Number of children: 2  . Years of education: N/A   Occupational History  . engineer    Social History Main Topics  . Smoking status: Light Tobacco Smoker    Last attempt to quit: 10/27/1983  . Smokeless tobacco: Never Used     Comment: quit in the 80s  . Alcohol use No  . Drug use: No  . Sexual activity: Not on file   Other Topics Concern  . Not on  file   Social History Narrative   Original from [place]   Lives at home with wife.     Past Surgical History:  Procedure Laterality Date  . HEMORRHOID BANDING  01/01/2016  . KNEE SURGERY Right 2014  . TONSILLECTOMY    . WISDOM TOOTH EXTRACTION  2009    Family History  Problem Relation Age of Onset  . Hypertension Father   . Cancer Father        type?   . Diabetes Father   . Heart disease Father   . Heart attack Paternal Grandfather   . Diabetes Brother   . Colon cancer Neg Hx   . Prostate cancer Neg Hx     No Known Allergies  Current Outpatient Prescriptions on File Prior to Visit  Medication Sig Dispense Refill  . aspirin 81 MG tablet Take 81 mg by mouth daily.    Marland Kitchen atorvastatin (LIPITOR) 40 MG tablet Take 0.5 tablets (20 mg total) by mouth daily. 45 tablet 2  . metFORMIN (GLUCOPHAGE) 500 MG tablet Take 1 tablet (500 mg total) by mouth daily. 90 tablet 2  . ONE TOUCH ULTRA TEST test strip Check blood sugar no more than twice daily 100 each 12  . ONETOUCH DELICA LANCETS FINE MISC Check blood sugar no more than twice daily. 100 each 12   No current facility-administered medications on file prior to visit.     BP (!) 135/93   Pulse 78  Temp 98.2 F (36.8 C) (Oral)   Resp 16   Ht 5\' 9"  (1.753 m)   Wt 207 lb (93.9 kg)   SpO2 98%   BMI 30.57 kg/m       Objective:   Physical Exam  General  Mental Status - Alert. General Appearance - Well groomed. Not in acute distress.  Skin Rashes- No Rashes.  HEENT Head- Normal. Ear Auditory Canal - Left- Normal. Right - Normal.Tympanic Membrane- Left- Normal. Right- Normal. Eye Sclera/Conjunctiva- Left- Normal. Right- Normal. Nose & Sinuses Nasal Mucosa- Left-  Boggy and Congested. Right-  Boggy and  Congested.Bilateral mild  maxillary and  Mild frontal sinus pressure. Mouth & Throat Lips: Upper Lip- Normal: no dryness, cracking, pallor, cyanosis, or vesicular eruption. Lower Lip-Normal: no dryness, cracking,  pallor, cyanosis or vesicular eruption. Buccal Mucosa- Bilateral- No Aphthous ulcers. Oropharynx- No Discharge or Erythema. +pnd Tonsils: Characteristics- Bilateral- moderate . Size/Enlargement- Bilateral- No enlargement. Discharge- bilateral-None.  Neck Neck- Supple. No Masses.   Chest and Lung Exam Auscultation: Breath Sounds:-Clear even and unlabored.  Cardiovascular Auscultation:Rythm- Regular, rate and rhythm. Murmurs & Other Heart Sounds:Ausculatation of the heart reveal- No Murmurs.  Lymphatic Head & Neck General Head & Neck Lymphatics: Bilateral: Description- No Localized lymphadenopathy.       Assessment & Plan:  You appear to have a sinus infection. I am prescribing antibiotic augmentin for the infection. To help with the nasal congestion I prescribed nasal steroid flonase(possible allergy component). For your associated cough, I prescribed cough medicine benzonatate.  ST present and augmentin adequate in event you have strep.   Rest, hydrate, tylenol for fever.  Follow up in 7 days or as needed.

## 2016-09-11 NOTE — Patient Instructions (Addendum)
You appear to have a sinus infection. I am prescribing antibiotic augmentin for the infection. To help with the nasal congestion I prescribed nasal steroid flonase(possible allergy component). For your associated cough, I prescribed cough medicine benzonatate.  ST present and augmentin adequate in event you have strep.   Rest, hydrate, tylenol for fever.  Follow up in 7 days or as needed.

## 2017-03-03 ENCOUNTER — Ambulatory Visit (INDEPENDENT_AMBULATORY_CARE_PROVIDER_SITE_OTHER): Payer: Managed Care, Other (non HMO) | Admitting: Internal Medicine

## 2017-03-03 ENCOUNTER — Encounter: Payer: Self-pay | Admitting: Internal Medicine

## 2017-03-03 VITALS — BP 126/74 | HR 68 | Temp 97.8°F | Resp 14 | Ht 69.0 in | Wt 215.1 lb

## 2017-03-03 DIAGNOSIS — E119 Type 2 diabetes mellitus without complications: Secondary | ICD-10-CM | POA: Diagnosis not present

## 2017-03-03 DIAGNOSIS — Z0000 Encounter for general adult medical examination without abnormal findings: Secondary | ICD-10-CM

## 2017-03-03 LAB — LIPID PANEL
CHOL/HDL RATIO: 4
CHOLESTEROL: 151 mg/dL (ref 0–200)
HDL: 40.6 mg/dL (ref >39.00)
LDL CALC: 88 mg/dL (ref 0–99)
NonHDL: 110.75
Triglycerides: 112 mg/dL (ref 0.0–149.0)
VLDL: 22.4 mg/dL (ref 0.0–40.0)

## 2017-03-03 LAB — COMPREHENSIVE METABOLIC PANEL
ALT: 22 U/L (ref 0–53)
AST: 18 U/L (ref 0–37)
Albumin: 4.6 g/dL (ref 3.5–5.2)
Alkaline Phosphatase: 63 U/L (ref 39–117)
BILIRUBIN TOTAL: 1 mg/dL (ref 0.2–1.2)
BUN: 14 mg/dL (ref 6–23)
CO2: 30 meq/L (ref 19–32)
Calcium: 9.5 mg/dL (ref 8.4–10.5)
Chloride: 101 mEq/L (ref 96–112)
Creatinine, Ser: 0.9 mg/dL (ref 0.40–1.50)
GFR: 89.48 mL/min (ref >60.00)
GLUCOSE: 133 mg/dL — AB (ref 70–99)
Potassium: 4.5 mEq/L (ref 3.5–5.1)
Sodium: 138 mEq/L (ref 135–145)
Total Protein: 7.4 g/dL (ref 6.0–8.3)

## 2017-03-03 LAB — HEMOGLOBIN A1C: HEMOGLOBIN A1C: 6.8 % — AB (ref 4.6–6.5)

## 2017-03-03 LAB — CBC WITH DIFFERENTIAL/PLATELET
BASOS ABS: 0.1 10*3/uL (ref 0.0–0.1)
BASOS PCT: 0.8 % (ref 0.0–3.0)
EOS ABS: 0.2 10*3/uL (ref 0.0–0.7)
Eosinophils Relative: 2.7 % (ref 0.0–5.0)
HCT: 45.9 % (ref 39.0–52.0)
HEMOGLOBIN: 15.4 g/dL (ref 13.0–17.0)
LYMPHS PCT: 26.2 % (ref 12.0–46.0)
Lymphs Abs: 2.1 10*3/uL (ref 0.7–4.0)
MCHC: 33.4 g/dL (ref 30.0–36.0)
MCV: 91.5 fl (ref 78.0–100.0)
MONO ABS: 0.9 10*3/uL (ref 0.1–1.0)
Monocytes Relative: 10.9 % (ref 3.0–12.0)
NEUTROS ABS: 4.7 10*3/uL (ref 1.4–7.7)
Neutrophils Relative %: 59.4 % (ref 43.0–77.0)
PLATELETS: 200 10*3/uL (ref 150.0–400.0)
RBC: 5.02 Mil/uL (ref 4.22–5.81)
RDW: 12.4 % (ref 11.5–15.5)
WBC: 7.9 10*3/uL (ref 4.0–10.5)

## 2017-03-03 LAB — TSH: TSH: 0.86 u[IU]/mL (ref 0.35–4.50)

## 2017-03-03 LAB — MICROALBUMIN / CREATININE URINE RATIO
CREATININE, U: 117.2 mg/dL
Microalb Creat Ratio: 1.7 mg/g (ref 0.0–30.0)
Microalb, Ur: 1.9 mg/dL (ref 0.0–1.9)

## 2017-03-03 LAB — PSA: PSA: 0.97 ng/mL (ref 0.10–4.00)

## 2017-03-03 MED ORDER — ATORVASTATIN CALCIUM 40 MG PO TABS: 20.0000 mg | ORAL_TABLET | Freq: Every day | ORAL | 2 refills | Status: DC

## 2017-03-03 MED ORDER — METFORMIN HCL 500 MG PO TABS: 500.0000 mg | ORAL_TABLET | Freq: Every day | ORAL | 2 refills | Status: DC

## 2017-03-03 NOTE — Assessment & Plan Note (Addendum)
-   Td 2012; pnm 23 2017; pnm 13: 2012; had a shingles shot Had a flu shot  -CCS: Cscope 2014 per [facility], no report , next per GI -Prostate cancer screening: Declined a DRE, check a PSA -Labs: CMP, FLP, CBC, A1c, micral, PSA, TSH -Diet and exercise discussed

## 2017-03-03 NOTE — Patient Instructions (Signed)
GO TO THE LAB : Get the blood work     GO TO THE FRONT DESK Schedule your next appointment for a  routine checkup in 6 months  

## 2017-03-03 NOTE — Progress Notes (Signed)
Pre visit review using our clinic review tool, if applicable. No additional management support is needed unless otherwise documented below in the visit note. 

## 2017-03-03 NOTE — Assessment & Plan Note (Signed)
DM: Continue metformin, checking labs, encourage a healthy diet and exercise Hyperlipidemia: Check labs. RTC 6 months

## 2017-03-03 NOTE — Progress Notes (Signed)
Subjective:    Patient ID: Alexander Blair, male    DOB: 08/16/1950, 67 y.o.   MRN: [id number]  DOS:  03/03/2017 Type of visit - description : CPX Interval history: No major concerns.  Ambulatory CBGs at night 109, 130.   Review of Systems    A 14 point review of systems is negative    Past Medical History:  Diagnosis Date  . Diabetes ([place])   . Diverticulosis   . ED (erectile dysfunction) 07/2014  . Hyperlipidemia   . Left patella fracture   . Low testosterone 07/2014    Past Surgical History:  Procedure Laterality Date  . HEMORRHOID BANDING  01/01/2016  . KNEE SURGERY Right 2014  . TONSILLECTOMY    . WISDOM TOOTH EXTRACTION  2009    Social History   Socioeconomic History  . Marital status: Married    Spouse name: Not on file  . Number of children: 2  . Years of education: Not on file  . Highest education level: Not on file  Social Needs  . Financial resource strain: Not on file  . Food insecurity - worry: Not on file  . Food insecurity - inability: Not on file  . Transportation needs - medical: Not on file  . Transportation needs - non-medical: Not on file  Occupational History  . Occupation: Chief Financial Officer  Tobacco Use  . Smoking status: Light Tobacco Smoker    Last attempt to quit: 10/27/1983    Years since quitting: 33.3  . Smokeless tobacco: Never Used  . Tobacco comment: quit in the 80s  Substance and Sexual Activity  . Alcohol use: No    Alcohol/week: 0.0 oz  . Drug use: No  . Sexual activity: Not on file  Other Topics Concern  . Not on file  Social History Narrative   Original from [place]   Lives at home with wife.      Family History  Problem Relation Age of Onset  . Hypertension Father   . Cancer Father        type?   . Diabetes Father   . Heart disease Father   . Heart attack Paternal Grandfather   . Diabetes Brother   . Colon cancer Neg Hx   . Prostate cancer Neg Hx     Allergies as of 03/03/2017   No Known Allergies       Medication List        Accurate as of 03/03/17  1:07 PM. Always use your most recent med list.          aspirin 81 MG tablet Take 81 mg by mouth daily.   atorvastatin 40 MG tablet Commonly known as:  LIPITOR Take 0.5 tablets (20 mg total) by mouth daily.   fluticasone 50 MCG/ACT nasal spray Commonly known as:  FLONASE Place 2 sprays into both nostrils daily.   metFORMIN 500 MG tablet Commonly known as:  GLUCOPHAGE Take 1 tablet (500 mg total) by mouth daily.   ONE TOUCH ULTRA TEST test strip Generic drug:  glucose blood Check blood sugar no more than twice daily   ONETOUCH DELICA LANCETS FINE Misc Check blood sugar no more than twice daily.          Objective:   Physical Exam BP 126/74 (BP Location: Left Arm, Patient Position: Sitting, Cuff Size: Normal)   Pulse 68   Temp 97.8 F (36.6 C) (Oral)   Resp 14   Ht 5\' 9"  (1.753 m)   Wt 215 lb  2 oz (97.6 kg)   SpO2 98%   BMI 31.77 kg/m  General:   Well developed, well nourished . NAD.  Neck: No  thyromegaly  HEENT:  Normocephalic . Face symmetric, atraumatic Lungs:  CTA B Normal respiratory effort, no intercostal retractions, no accessory muscle use. Heart: RRR,  no murmur.  No pretibial edema bilaterally  Abdomen:  Not distended, soft, non-tender. No rebound or rigidity.   Skin: Exposed areas without rash. Not pale. Not jaundice Neurologic:  alert & oriented X3.  Speech normal, gait appropriate for age and unassisted Strength symmetric and appropriate for age.  Psych: Cognition and judgment appear intact.  Cooperative with normal attention span and concentration.  Behavior appropriate. No anxious or depressed appearing.     Assessment & Plan:   Assessment DM- dx ~ 2010 Hyperlipidemia  Treadmill test 11-2013 (-)  Plan: DM: Continue metformin, checking labs, encourage a healthy diet and exercise Hyperlipidemia: Check labs. RTC 6 months

## 2017-03-24 ENCOUNTER — Telehealth: Payer: Self-pay | Admitting: Internal Medicine

## 2017-03-24 NOTE — Telephone Encounter (Signed)
Copied from [place] [id number]. Topic: Quick Communication - See Telephone Encounter >> Mar 24, 2017  4:52 PM Bea Graff, NT wrote: CRM for notification. See Telephone encounter for: Pt states that the atorvastatin (LIPITOR) was suppose to go to [facility] instead of [facility].   03/24/17.

## 2017-03-25 ENCOUNTER — Other Ambulatory Visit: Payer: Self-pay | Admitting: *Deleted

## 2017-03-25 MED ORDER — METFORMIN HCL 500 MG PO TABS: 500.0000 mg | ORAL_TABLET | Freq: Every day | ORAL | 2 refills | Status: DC

## 2017-03-25 MED ORDER — ATORVASTATIN CALCIUM 40 MG PO TABS: 20.0000 mg | ORAL_TABLET | Freq: Every day | ORAL | 2 refills | Status: DC

## 2017-04-29 LAB — HM DIABETES EYE EXAM

## 2017-09-10 ENCOUNTER — Encounter: Payer: Self-pay | Admitting: Internal Medicine

## 2017-09-17 ENCOUNTER — Encounter: Payer: Self-pay | Admitting: Internal Medicine

## 2017-09-17 ENCOUNTER — Ambulatory Visit: Payer: Managed Care, Other (non HMO) | Admitting: Internal Medicine

## 2017-09-17 VITALS — BP 139/85 | HR 71 | Temp 98.2°F | Resp 16 | Ht 69.0 in | Wt 196.8 lb

## 2017-09-17 DIAGNOSIS — E785 Hyperlipidemia, unspecified: Secondary | ICD-10-CM | POA: Diagnosis not present

## 2017-09-17 DIAGNOSIS — E119 Type 2 diabetes mellitus without complications: Secondary | ICD-10-CM | POA: Diagnosis not present

## 2017-09-17 LAB — BASIC METABOLIC PANEL
BUN: 14 mg/dL (ref 6–23)
CHLORIDE: 104 meq/L (ref 96–112)
CO2: 30 meq/L (ref 19–32)
CREATININE: 0.87 mg/dL (ref 0.40–1.50)
Calcium: 9.7 mg/dL (ref 8.4–10.5)
GFR: 92.9 mL/min (ref >60.00)
GLUCOSE: 120 mg/dL — AB (ref 70–99)
Potassium: 4.2 mEq/L (ref 3.5–5.1)
Sodium: 140 mEq/L (ref 135–145)

## 2017-09-17 LAB — HEMOGLOBIN A1C: Hgb A1c MFr Bld: 6.1 % (ref 4.6–6.5)

## 2017-09-17 MED ORDER — ATORVASTATIN CALCIUM 40 MG PO TABS: 20.0000 mg | ORAL_TABLET | Freq: Every day | ORAL | 1 refills | Status: DC

## 2017-09-17 MED ORDER — METFORMIN HCL 500 MG PO TABS: 500.0000 mg | ORAL_TABLET | Freq: Every day | ORAL | 1 refills | Status: DC

## 2017-09-17 NOTE — Patient Instructions (Signed)
GO TO THE LAB : Get the blood work     GO TO THE FRONT DESK Schedule your next appointment for a physical exam in 6 months, fasting.

## 2017-09-17 NOTE — Progress Notes (Signed)
Subjective:    Patient ID: Alexander Blair, male    DOB: 1950-04-16, 67 y.o.   MRN: [id number]  DOS:  09/17/2017 Type of visit - description : rov Interval history: Doing well, he retired, good med compliance except for the last 2-3 days, he ran out and needs a prescription.   Review of Systems Denies chest pain no difficulty breathing No nausea, vomiting, diarrhea. No lower extremity paresthesias  Past Medical History:  Diagnosis Date  . Diabetes ([place])   . Diverticulosis   . ED (erectile dysfunction) 07/2014  . Hyperlipidemia   . Left patella fracture   . Low testosterone 07/2014    Past Surgical History:  Procedure Laterality Date  . HEMORRHOID BANDING  01/01/2016  . KNEE SURGERY Right 2014  . TONSILLECTOMY    . WISDOM TOOTH EXTRACTION  2009    Social History   Socioeconomic History  . Marital status: Married    Spouse name: Not on file  . Number of children: 2  . Years of education: Not on file  . Highest education level: Not on file  Occupational History  . Occupation: Chief Financial Officer, retired 2019  Social Needs  . Financial resource strain: Not on file  . Food insecurity:    Worry: Not on file    Inability: Not on file  . Transportation needs:    Medical: Not on file    Non-medical: Not on file  Tobacco Use  . Smoking status: Light Tobacco Smoker    Last attempt to quit: 10/27/1983    Years since quitting: 33.9  . Smokeless tobacco: Never Used  . Tobacco comment: quit in the 80s  Substance and Sexual Activity  . Alcohol use: No    Alcohol/week: 0.0 oz  . Drug use: No  . Sexual activity: Not on file  Lifestyle  . Physical activity:    Days per week: Not on file    Minutes per session: Not on file  . Stress: Not on file  Relationships  . Social connections:    Talks on phone: Not on file    Gets together: Not on file    Attends religious service: Not on file    Active member of club or organization: Not on file    Attends meetings of clubs or  organizations: Not on file    Relationship status: Not on file  . Intimate partner violence:    Fear of current or ex partner: Not on file    Emotionally abused: Not on file    Physically abused: Not on file    Forced sexual activity: Not on file  Other Topics Concern  . Not on file  Social History Narrative   Original from [place]   Lives at home with wife.       Allergies as of 09/17/2017   No Known Allergies     Medication List        Accurate as of 09/17/17 11:59 PM. Always use your most recent med list.          aspirin 81 MG tablet Take 81 mg by mouth daily.   atorvastatin 40 MG tablet Commonly known as:  LIPITOR Take 0.5 tablets (20 mg total) by mouth daily.   fluticasone 50 MCG/ACT nasal spray Commonly known as:  FLONASE Place 2 sprays into both nostrils daily.   metFORMIN 500 MG tablet Commonly known as:  GLUCOPHAGE Take 1 tablet (500 mg total) by mouth daily.   ONE TOUCH ULTRA TEST test strip  Generic drug:  glucose blood Check blood sugar no more than twice daily   ONETOUCH DELICA LANCETS FINE Misc Check blood sugar no more than twice daily.          Objective:   Physical Exam BP 139/85 (BP Location: Left Arm, Patient Position: Sitting, Cuff Size: Small)   Pulse 71   Temp 98.2 F (36.8 C) (Oral)   Resp 16   Ht 5\' 9"  (1.753 m)   Wt 196 lb 12.8 oz (89.3 kg)   SpO2 99%   BMI 29.06 kg/m  General:   Well developed, NAD, see BMI.  HEENT:  Normocephalic . Face symmetric, atraumatic Lungs:  CTA B Normal respiratory effort, no intercostal retractions, no accessory muscle use. Heart: RRR,  no murmur.  No pretibial edema bilaterally  DIABETIC FEET EXAM: No lower extremity edema Normal pedal pulses bilaterally Skin normal, nails normal, no calluses Pinprick examination of the feet normal. Neurologic:  alert & oriented X3.  Speech normal, gait appropriate for age and unassisted Psych--  Cognition and judgment appear intact.  Cooperative with  normal attention span and concentration.  Behavior appropriate. No anxious or depressed appearing.      Assessment & Plan:   Assessment DM- dx ~ 2010 Hyperlipidemia  Treadmill test 11-2013 (-)  Plan: DM: On metformin, check a BMP and A1c.  In the last few weeks he was traveling in [place] and his diet was not as good as usual, plans to go back to his regular routine.  Feet exam negative.  Refill sent. Hyperlipidemia: Refill Lipitor.  Well-controlled Social: Patient recently retired. RTC 6 months CPX

## 2017-09-18 NOTE — Assessment & Plan Note (Signed)
DM: On metformin, check a BMP and A1c.  In the last few weeks he was traveling in [place] and his diet was not as good as usual, plans to go back to his regular routine.  Feet exam negative.  Refill sent. Hyperlipidemia: Refill Lipitor.  Well-controlled Social: Patient recently retired. RTC 6 months CPX

## 2017-09-24 ENCOUNTER — Ambulatory Visit: Payer: Managed Care, Other (non HMO) | Admitting: Internal Medicine

## 2017-10-08 ENCOUNTER — Ambulatory Visit: Payer: Managed Care, Other (non HMO) | Admitting: Internal Medicine

## 2017-10-09 ENCOUNTER — Ambulatory Visit: Payer: Managed Care, Other (non HMO) | Admitting: Internal Medicine

## 2017-11-13 IMAGING — DX DG KNEE COMPLETE 4+V*L*
4 series · 4 of 4 positions shown · non-contrast
Comparison: None.

CLINICAL DATA: Fell several weeks ago with anterior left knee pain
and swelling

EXAM:
LEFT KNEE - COMPLETE 4+ VIEW

[knee ap]
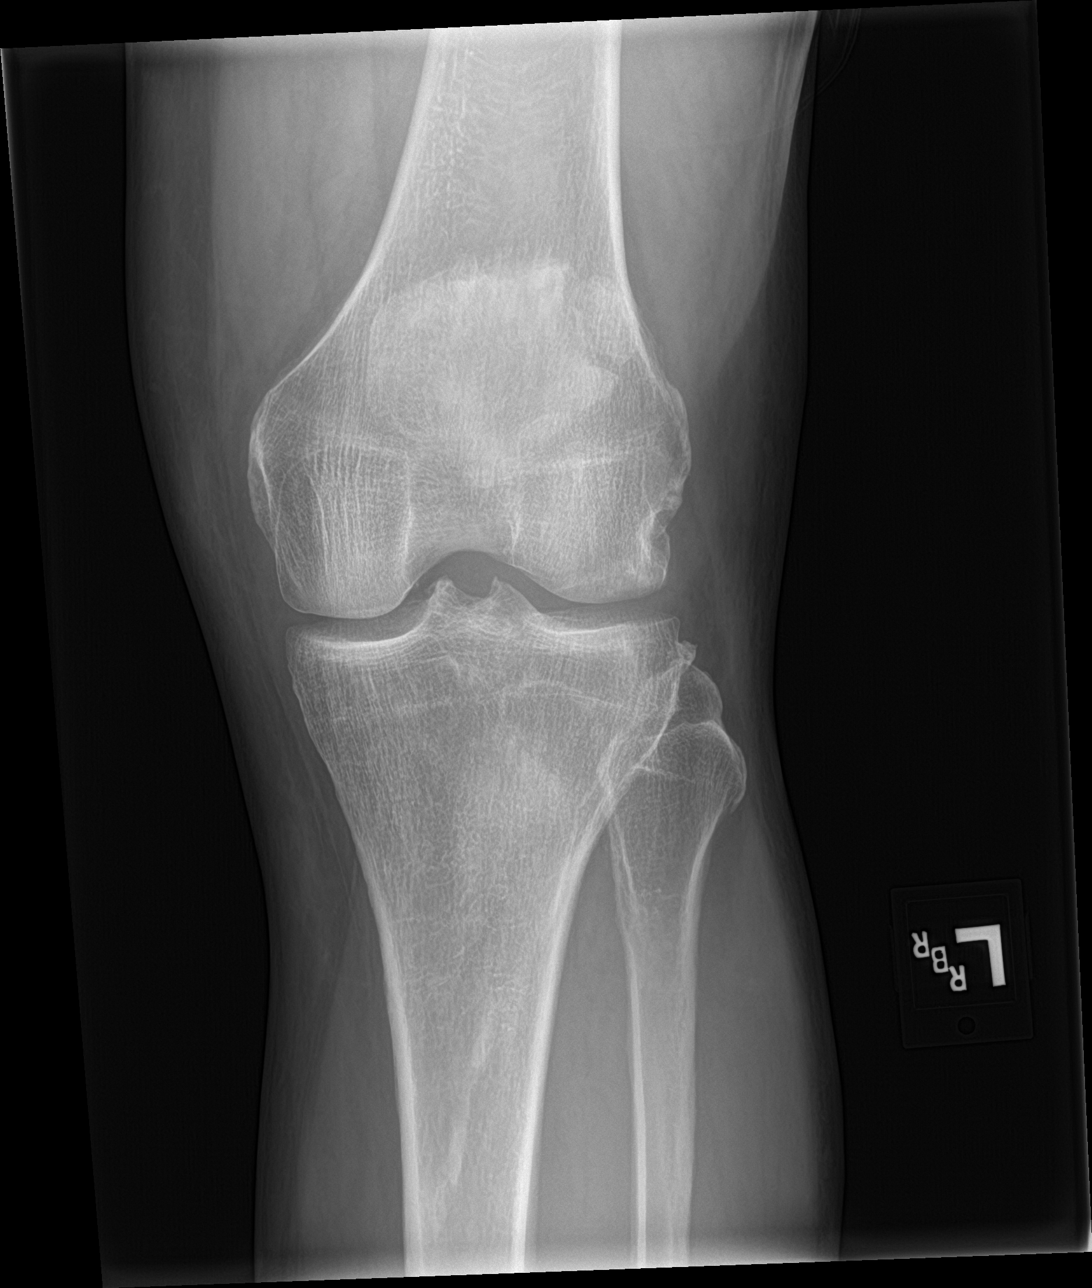

[tunnel]
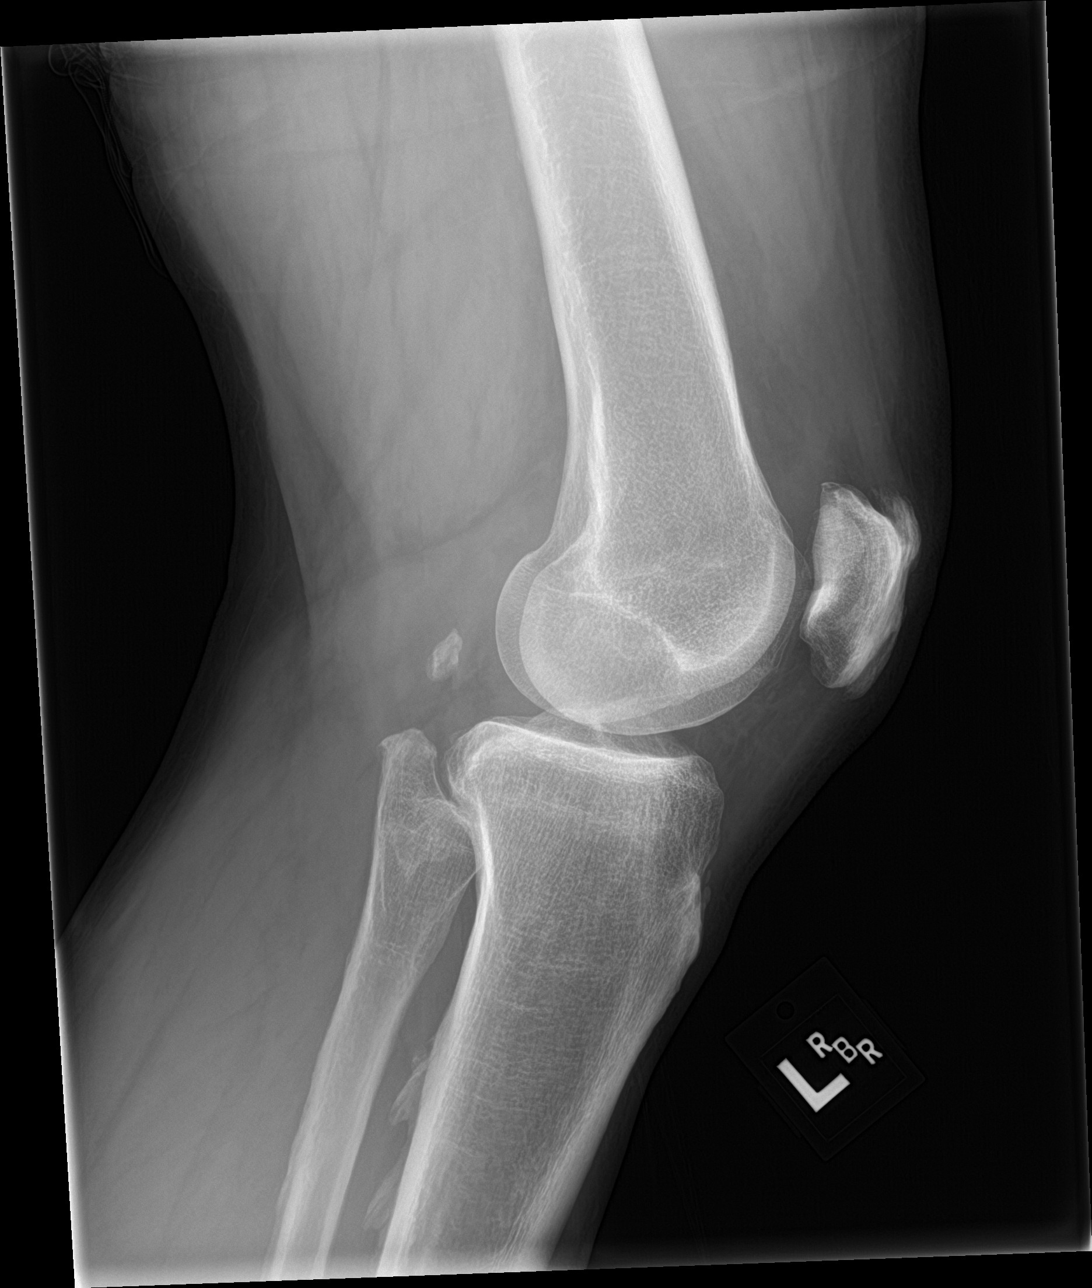

[knee obl (1 of 2)]
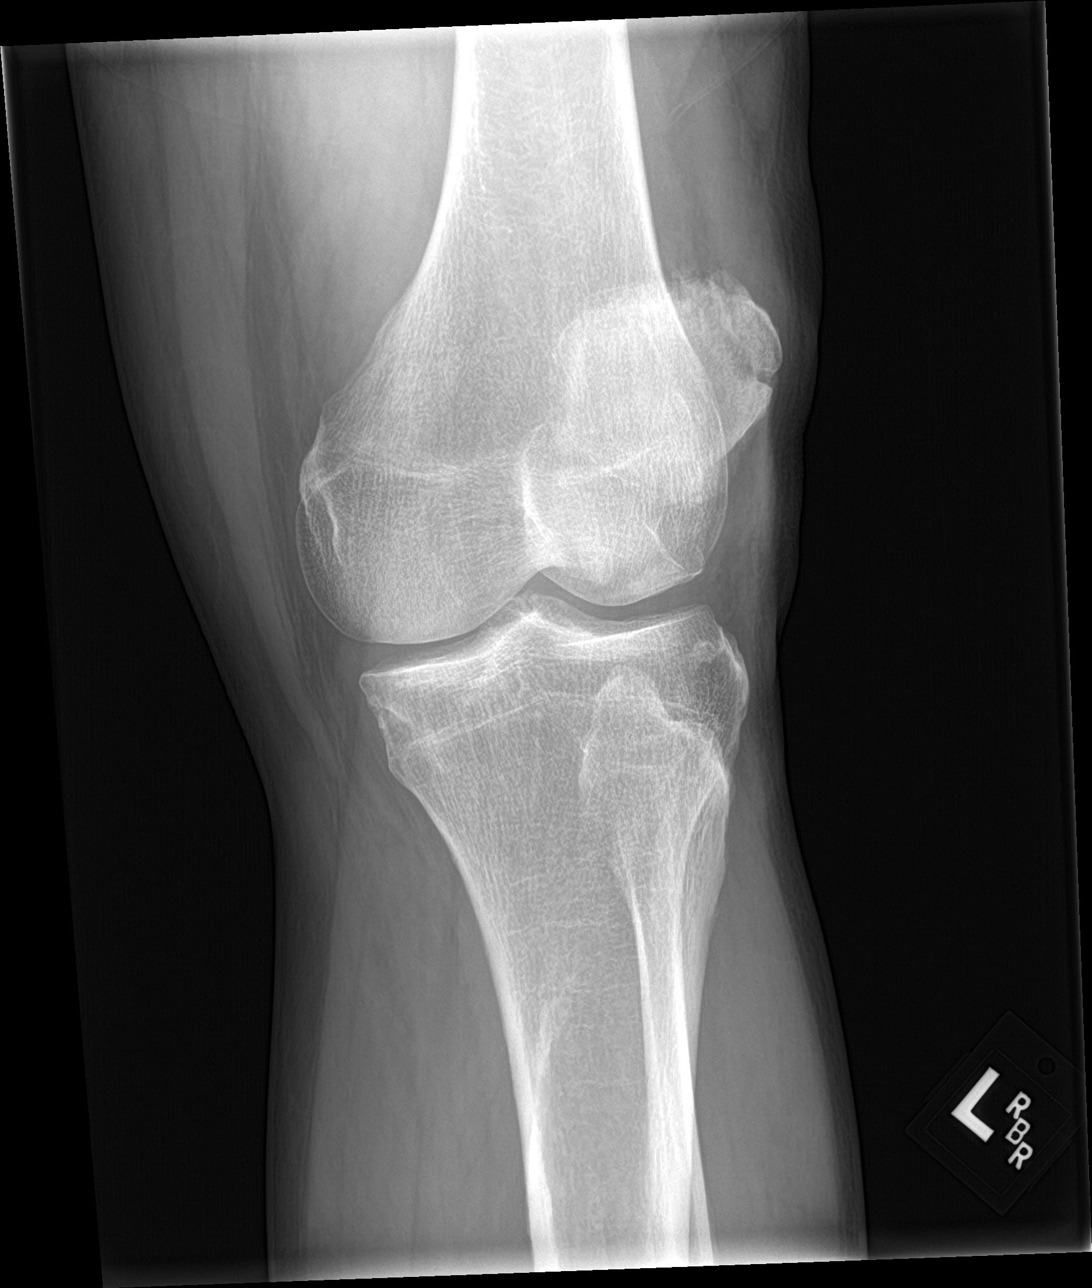

[knee obl (2 of 2)]
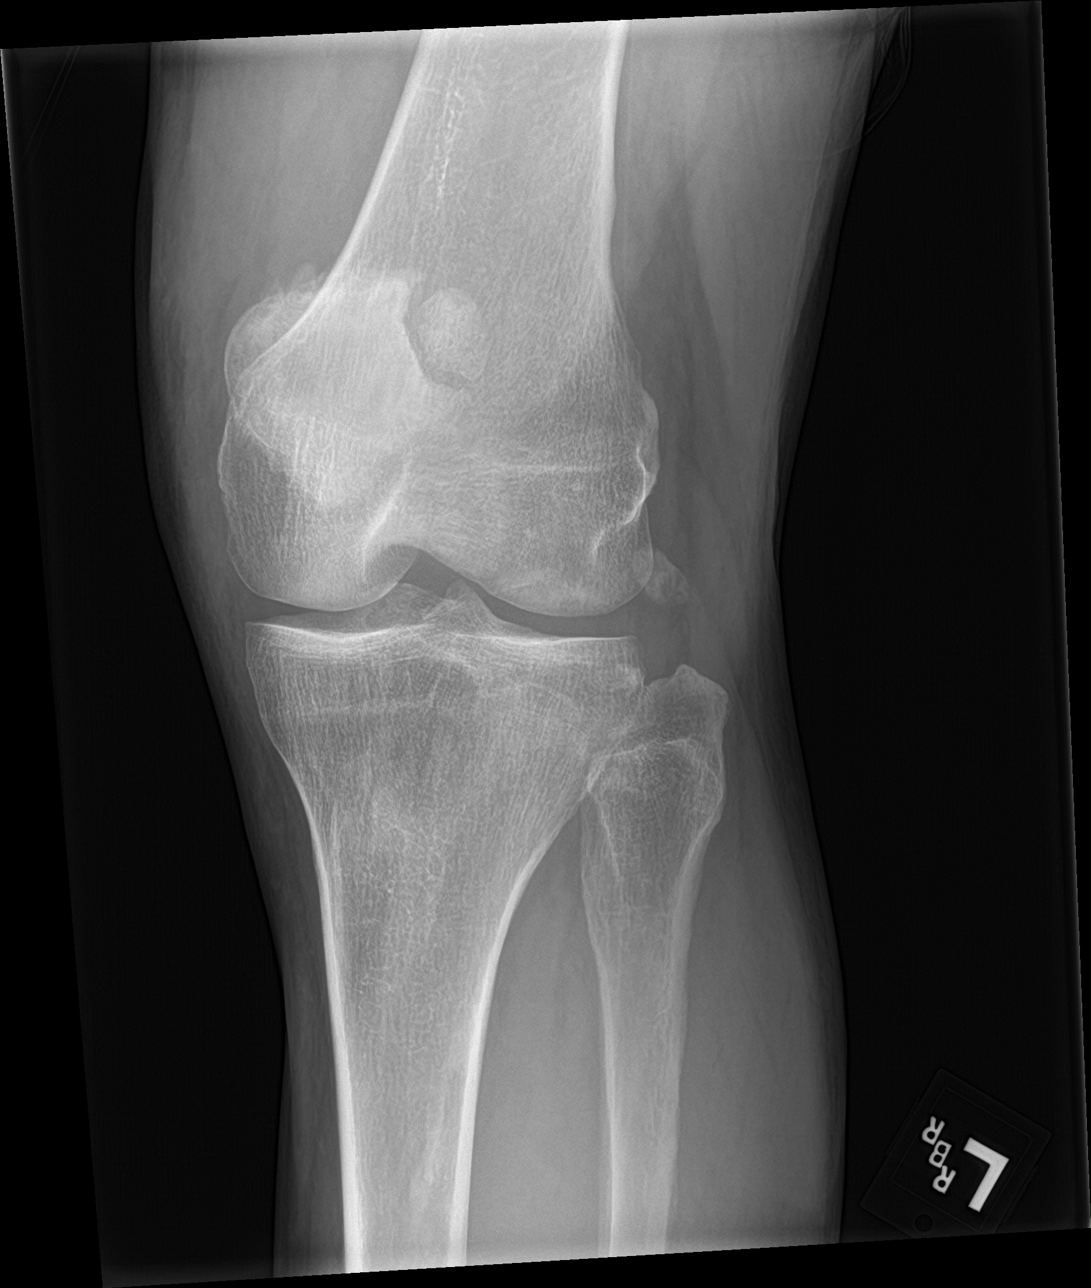

[4 of 4 positions shown; findings below may reference images not displayed]

FINDINGS: The knee joint spaces are relatively well preserved for age with
very little degenerative joint disease. A lucency through the
lateral aspect of the patella appears most typical of normal
variation of bipartite patella rather than fracture. No joint space
effusion is seen.
IMPRESSION: Minimal degenerative joint disease for age. No acute fracture. No
effusion. Apparent bipartite patella.

## 2017-11-27 ENCOUNTER — Other Ambulatory Visit: Payer: Self-pay

## 2017-11-27 MED ORDER — ONETOUCH ULTRA BLUE VI STRP: ORAL_STRIP | 12 refills | Status: AC

## 2017-11-27 MED ORDER — ONETOUCH DELICA LANCETS FINE MISC: 12 refills | Status: AC

## 2018-04-06 ENCOUNTER — Encounter: Payer: Managed Care, Other (non HMO) | Admitting: Internal Medicine

## 2018-04-14 ENCOUNTER — Encounter: Payer: Managed Care, Other (non HMO) | Admitting: Internal Medicine

## 2018-05-03 ENCOUNTER — Other Ambulatory Visit: Payer: Self-pay | Admitting: Internal Medicine

## 2018-05-03 DIAGNOSIS — E119 Type 2 diabetes mellitus without complications: Secondary | ICD-10-CM

## 2018-05-05 ENCOUNTER — Other Ambulatory Visit: Payer: Self-pay | Admitting: Internal Medicine

## 2018-07-25 ENCOUNTER — Other Ambulatory Visit: Payer: Self-pay | Admitting: Internal Medicine

## 2018-07-25 DIAGNOSIS — E119 Type 2 diabetes mellitus without complications: Secondary | ICD-10-CM

## 2018-07-31 ENCOUNTER — Other Ambulatory Visit: Payer: Self-pay | Admitting: Internal Medicine

## 2018-08-20 ENCOUNTER — Other Ambulatory Visit: Payer: Self-pay | Admitting: Internal Medicine

## 2018-08-20 DIAGNOSIS — E119 Type 2 diabetes mellitus without complications: Secondary | ICD-10-CM

## 2018-08-24 ENCOUNTER — Other Ambulatory Visit: Payer: Self-pay | Admitting: Internal Medicine

## 2018-08-24 DIAGNOSIS — E119 Type 2 diabetes mellitus without complications: Secondary | ICD-10-CM

## 2018-09-01 ENCOUNTER — Other Ambulatory Visit: Payer: Self-pay | Admitting: Internal Medicine

## 2018-10-03 ENCOUNTER — Other Ambulatory Visit: Payer: Self-pay | Admitting: Internal Medicine

## 2018-11-29 ENCOUNTER — Other Ambulatory Visit: Payer: Self-pay | Admitting: Internal Medicine

## 2020-05-23 ENCOUNTER — Encounter: Payer: Self-pay | Admitting: Internal Medicine
